# Patient Record
Sex: Male | Born: 2005 | Race: Black or African American | Hispanic: No | Marital: Single | Smoking: Never smoker
Health system: Southern US, Community
[De-identification: ages and names within clinical notes are randomized; demographics above are authoritative.]

## PROBLEM LIST (undated history)

## (undated) ENCOUNTER — Ambulatory Visit: Source: Home / Self Care

## (undated) DIAGNOSIS — F32A Depression, unspecified: Secondary | ICD-10-CM

## (undated) DIAGNOSIS — J45909 Unspecified asthma, uncomplicated: Secondary | ICD-10-CM

## (undated) DIAGNOSIS — F419 Anxiety disorder, unspecified: Secondary | ICD-10-CM

## (undated) DIAGNOSIS — R51 Headache: Secondary | ICD-10-CM

## (undated) DIAGNOSIS — J302 Other seasonal allergic rhinitis: Secondary | ICD-10-CM

## (undated) DIAGNOSIS — G43909 Migraine, unspecified, not intractable, without status migrainosus: Secondary | ICD-10-CM

## (undated) HISTORY — PX: TESTICLE TORSION REDUCTION: SHX795

## (undated) HISTORY — DX: Headache: R51

## (undated) HISTORY — PX: CIRCUMCISION: SUR203

## (undated) HISTORY — PX: CYST REMOVAL TRUNK: SHX6283

---
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
## 2024-01-07 ENCOUNTER — Other Ambulatory Visit: Payer: Self-pay | Admitting: Neurology

## 2024-01-07 DIAGNOSIS — G43019 Migraine without aura, intractable, without status migrainosus: Secondary | ICD-10-CM

## 2024-01-07 DIAGNOSIS — G939 Disorder of brain, unspecified: Secondary | ICD-10-CM

## 2024-01-07 DIAGNOSIS — H539 Unspecified visual disturbance: Secondary | ICD-10-CM

## 2024-01-07 DIAGNOSIS — R9089 Other abnormal findings on diagnostic imaging of central nervous system: Secondary | ICD-10-CM

## 2024-02-01 ENCOUNTER — Ambulatory Visit
Admission: RE | Admit: 2024-02-01 | Discharge: 2024-02-01 | Disposition: A | Discharge status: Home / Self Care | Source: Ambulatory Visit | Attending: Neurology | Admitting: Neurology

## 2024-02-01 DIAGNOSIS — G43019 Migraine without aura, intractable, without status migrainosus: Secondary | ICD-10-CM

## 2024-02-01 DIAGNOSIS — R9089 Other abnormal findings on diagnostic imaging of central nervous system: Secondary | ICD-10-CM

## 2024-02-01 DIAGNOSIS — G939 Disorder of brain, unspecified: Secondary | ICD-10-CM

## 2024-02-01 DIAGNOSIS — H539 Unspecified visual disturbance: Secondary | ICD-10-CM

## 2024-02-01 MED ORDER — GADOPICLENOL 0.5 MMOL/ML IV SOLN
10.0000 mL | Freq: Once | INTRAVENOUS | Status: AC | PRN
  Administered 2024-02-01: 10 mL via INTRAVENOUS
